# Patient Record
Sex: Male | Born: 2000 | Race: White | Hispanic: No | Marital: Single | Smoking: Current every day smoker
Health system: Southern US, Community
[De-identification: ages and names within clinical notes are randomized; demographics above are authoritative.]

## PROBLEM LIST (undated history)

## (undated) HISTORY — PX: SKIN GRAFT: SHX250

---
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
## 2018-12-06 ENCOUNTER — Emergency Department: Payer: Medicaid Other

## 2018-12-06 ENCOUNTER — Other Ambulatory Visit: Payer: Self-pay

## 2018-12-06 ENCOUNTER — Emergency Department
Admission: EM | Admit: 2018-12-06 | Discharge: 2018-12-06 | Disposition: A | Discharge status: Home / Self Care | Payer: Medicaid Other | Attending: Emergency Medicine | Admitting: Emergency Medicine

## 2018-12-06 DIAGNOSIS — R0602 Shortness of breath: Secondary | ICD-10-CM | POA: Insufficient documentation

## 2018-12-06 DIAGNOSIS — F17210 Nicotine dependence, cigarettes, uncomplicated: Secondary | ICD-10-CM | POA: Diagnosis not present

## 2018-12-06 DIAGNOSIS — F1120 Opioid dependence, uncomplicated: Secondary | ICD-10-CM | POA: Diagnosis not present

## 2018-12-06 DIAGNOSIS — R079 Chest pain, unspecified: Secondary | ICD-10-CM | POA: Diagnosis present

## 2018-12-06 DIAGNOSIS — Z720 Tobacco use: Secondary | ICD-10-CM

## 2018-12-06 LAB — CBC
HCT: 43.4 % (ref 39.0–52.0)
Hemoglobin: 14.7 g/dL (ref 13.0–17.0)
MCH: 29.3 pg (ref 26.0–34.0)
MCHC: 33.9 g/dL (ref 30.0–36.0)
MCV: 86.5 fL (ref 80.0–100.0)
Platelets: 280 10*3/uL (ref 150–400)
RBC: 5.02 MIL/uL (ref 4.22–5.81)
RDW: 11.9 % (ref 11.5–15.5)
WBC: 5.9 10*3/uL (ref 4.0–10.5)
nRBC: 0 % (ref 0.0–0.2)

## 2018-12-06 LAB — BASIC METABOLIC PANEL
Anion gap: 4 — ABNORMAL LOW (ref 5–15)
BUN: 11 mg/dL (ref 6–20)
CO2: 28 mmol/L (ref 22–32)
Calcium: 9.5 mg/dL (ref 8.9–10.3)
Chloride: 104 mmol/L (ref 98–111)
Creatinine, Ser: 0.81 mg/dL (ref 0.61–1.24)
GFR calc Af Amer: >60 mL/min (ref >60)
Glucose, Bld: 119 mg/dL — ABNORMAL HIGH (ref 70–99)
Potassium: 4.5 mmol/L (ref 3.5–5.1)
Sodium: 136 mmol/L (ref 135–145)

## 2018-12-06 LAB — TROPONIN I: Troponin I: <0.03 ng/mL (ref <0.03)

## 2018-12-06 MED ORDER — IOHEXOL 350 MG/ML SOLN
75.0000 mL | Freq: Once | INTRAVENOUS | Status: AC | PRN
  Administered 2018-12-06: 75 mL via INTRAVENOUS
  Filled 2018-12-06: qty 75

## 2018-12-06 NOTE — Discharge Instructions (Addendum)
Return to the emergency department if you develop severe pain, shortness of breath, lightheadedness or fainting, fever, thoughts of hurting yourself or anyone else, or any other symptoms concerning to you.

## 2018-12-06 NOTE — BH Assessment (Signed)
Per request of ER MD Sharma Covert), writer provided the pt. with information and instructions on how to access Outpatient [facility] Treatment ([facility]).  Patient denies SI/HI and AV/H.  _______________ [facility] [address],  [place], [place] [address] [phone]  [facility] [address]  [place], [place] [address] [phone]

## 2018-12-06 NOTE — ED Notes (Signed)
Patient transported to CT 

## 2018-12-06 NOTE — ED Triage Notes (Signed)
A&O, ambulatory. States chest pressure and hard time breathing. Appears anxiety. States symptoms x 1 week. Speaking in complete sentences. No distress noted. Was sent from [facility].

## 2018-12-06 NOTE — ED Notes (Signed)
[facility] remains in pt chart and will not let this RN verify pt hx. Pt states he smokes and uses oxycodone recreationally but denies ETOH. Denies any medical hx, only surgical hx is skin graft per pt.

## 2018-12-06 NOTE — ED Provider Notes (Addendum)
[facility] Emergency Department Provider Note  ____________________________________________  Time seen: Approximately 3:48 PM  I have reviewed the triage vital signs and the nursing notes.   HISTORY  Chief Complaint Chest Pain    HPI Jesse Holland is a 18 y.o. male with a history of 2 pack/day smoking as well as opioid abuse presenting for chest pain.  The patient reports that for the last several weeks, he develops a chest pain under the left chest and axilla, with shortness of breath.  This is associated with "having to remind myself to breathe" and concerns that he will stop breathing altogether.  He feels that this is something that could kill him.  He reports that he has been self decreasing his daily oxycodone use, from 80 mg daily now to 10 mg daily.  He continues to smoke 2 packs/day.  Last week, he attempted to take a Suboxone due to withdrawal symptoms, and after smoking cigarettes, his chest pain symptoms got worse.  I have asked him about anxiety and panic attacks, which he states he has had in the past, but does not feel the symptoms are similar.  He reports he was seen last weekend in the emergency department at [facility] " but they treated me like I was a joke."  He is concerned that no one is listening to him.  FH: No family history of blood clots.  SH: Opioid addiction.  No past medical history on file.  There are no active problems to display for this patient.    Allergies Patient has no known allergies.  No family history on file.  Social History Social History   Tobacco Use  . Smoking status: Not on file  Substance Use Topics  . Alcohol use: Not on file  . Drug use: Not on file    Review of Systems Constitutional: No fever/chills.  No lightheadedness or syncope. Eyes: No visual changes. ENT: No sore throat. No congestion or rhinorrhea. Cardiovascular: Positive chest pain. Denies palpitations. Respiratory: Positive  shortness of breath.  No cough. Gastrointestinal: No abdominal pain.  No nausea, no vomiting.  No diarrhea.  No constipation. Genitourinary: Negative for dysuria. Musculoskeletal: Negative for back pain. Skin: Negative for rash. Neurological: Negative for headaches. No focal numbness, tingling or weakness.  Psychiatric:Positive oxycodone abuse.  Positive smoking abuse.   ____________________________________________   PHYSICAL EXAM:  VITAL SIGNS: ED Triage Vitals  Enc Vitals Group     BP 12/06/18 1401 (!) 148/84     Pulse Rate 12/06/18 1401 (!) 104     Resp 12/06/18 1401 18     Temp 12/06/18 1401 98.4 F (36.9 C)     Temp Source 12/06/18 1401 Oral     SpO2 12/06/18 1401 100 %     Weight 12/06/18 1400 150 lb (68 kg)     Height 12/06/18 1400 5\' 8"  (1.727 m)     Head Circumference --      Peak Flow --      Pain Score 12/06/18 1359 0     Pain Loc --      Pain Edu? --      Excl. in GC? --     Constitutional: Alert and oriented. Answers questions appropriately. Eyes: Conjunctivae are normal.  EOMI. No scleral icterus. Head: Atraumatic. Nose: No congestion/rhinnorhea. Mouth/Throat: Mucous membranes are moist.  Neck: No stridor.  Supple.  No JVD.  No meningismus. Cardiovascular: Normal rate, regular rhythm. No murmurs, rubs or gallops.  Respiratory: Normal respiratory effort.  No accessory muscle use or retractions. Lungs CTAB.  No wheezes, rales or ronchi. Gastrointestinal: Soft, nontender and nondistended.  No guarding or rebound.  No peritoneal signs. Musculoskeletal: No LE edema. No ttp in the calves or palpable cords.  Negative Homan's sign. Neurologic:  A&Ox3.  Speech is clear.  Face and smile are symmetric.  EOMI.  Moves all extremities well. Skin:  Skin is warm, dry and intact. No rash noted. Psychiatric: Intermittently agitated during my exam, but I am able to calm him down with reassuring him verbally. ____________________________________________   LABS (all labs  ordered are listed, but only abnormal results are displayed)  Labs Reviewed  BASIC METABOLIC PANEL - Abnormal; Notable for the following components:      Result Value   Glucose, Bld 119 (*)    Anion gap 4 (*)    All other components within normal limits  CBC  TROPONIN I   ____________________________________________  EKG  ED ECG REPORT I, Anne-Caroline Sharma Covert, the attending physician, personally viewed and interpreted this ECG.   Date: 12/06/2018  EKG Time: 1356  Rate: 88  Rhythm: normal sinus rhythm  Axis: normal  Intervals:none  ST&T Change: No STEMI  ____________________________________________  RADIOLOGY  Dg Chest 2 View  Result Date: 12/06/2018 CLINICAL DATA:  Mid chest pressure and shortness of breath for 5 days, chest pain centrally radiating to LEFT side, smoker EXAM: CHEST - 2 VIEW COMPARISON:  None FINDINGS: Normal heart size, mediastinal contours, and pulmonary vascularity. Mild peribronchial thickening. No pulmonary infiltrate, pleural effusion, or pneumothorax. Bones unremarkable. IMPRESSION: Mild bronchitic changes without infiltrate. Electronically Signed   By: Ulyses Southward M.D.   On: 12/06/2018 14:31    ____________________________________________   PROCEDURES  Procedure(s) performed: None  Procedures  Critical Care performed: No ____________________________________________   INITIAL IMPRESSION / ASSESSMENT AND PLAN / ED COURSE  Pertinent labs & imaging results that were available during my care of the patient were reviewed by me and considered in my medical decision making (see chart for details).  18 y.o. male with a history of opioid addiction, 2 pack/day smoking, presenting with chest pain and shortness of breath.  Overall, the patient is hemodynamically stable.  His EKG does not show any ischemia, arrhythmia, or evidence of Brugada syndrome, hypertrophy or prolonged QTC.  The patient has a chest x-ray which shows no acute intracranial process.   We will get a CT of the chest to rule out PE, but my suspicion for this is low.  The patient's not anemic, his electrolytes are reassuring and his troponin is negative.  I do not suspect ACS or MI.  I have low suspicious that there may be psychiatric causes, including panic attacks, for his chest pain and shortness of breath.  However, the patient denies this.  I have asked him if he would like help with continuing to decrease his oxycodone use, and he has agreed so I have called [facility] he will give him outpatient center information.  I anticipate discharge home.  ----------------------------------------- 4:47 PM on 12/06/2018 -----------------------------------------  The patient CT chest does not show any acute cardiopulmonary process, including PE.  At this time, the patient will be discharged home.  He has been seen by Jerilynn Som from [facility] and given outpatient follow-up instructions.  ____________________________________________  FINAL CLINICAL IMPRESSION(S) / ED DIAGNOSES  Final diagnoses:  Tobacco abuse  Narcotic addiction (HCC)  Chest pain, unspecified type  Shortness of breath         NEW MEDICATIONS STARTED DURING  THIS VISIT:  New Prescriptions   No medications on file      Rockne Menghini, MD 12/06/18 1558    Rockne Menghini, MD 12/06/18 [phone]

## 2018-12-07 ENCOUNTER — Telehealth: Payer: Self-pay | Admitting: Physician Assistant

## 2018-12-07 NOTE — Telephone Encounter (Signed)
Patient called. Busy with patients. Front Information systems manager took message of name of patient and phone number. Called patient back on cell phone. No answer. Left voicemail. Believe patient is looking for contact information for PCP and behavioral health. SFS PA-C

## 2019-06-13 ENCOUNTER — Emergency Department
Admission: EM | Admit: 2019-06-13 | Discharge: 2019-06-14 | Disposition: A | Discharge status: Home / Self Care | Payer: Medicaid Other | Attending: Emergency Medicine | Admitting: Emergency Medicine

## 2019-06-13 ENCOUNTER — Encounter: Payer: Self-pay | Admitting: Emergency Medicine

## 2019-06-13 ENCOUNTER — Other Ambulatory Visit: Payer: Self-pay

## 2019-06-13 DIAGNOSIS — Z20828 Contact with and (suspected) exposure to other viral communicable diseases: Secondary | ICD-10-CM | POA: Insufficient documentation

## 2019-06-13 DIAGNOSIS — F17210 Nicotine dependence, cigarettes, uncomplicated: Secondary | ICD-10-CM | POA: Insufficient documentation

## 2019-06-13 DIAGNOSIS — F329 Major depressive disorder, single episode, unspecified: Secondary | ICD-10-CM | POA: Diagnosis not present

## 2019-06-13 DIAGNOSIS — F322 Major depressive disorder, single episode, severe without psychotic features: Secondary | ICD-10-CM

## 2019-06-13 DIAGNOSIS — F32A Depression, unspecified: Secondary | ICD-10-CM

## 2019-06-13 LAB — ETHANOL: Alcohol, Ethyl (B): <10 mg/dL (ref <10)

## 2019-06-13 LAB — COMPREHENSIVE METABOLIC PANEL
ALT: 21 U/L (ref 0–44)
AST: 24 U/L (ref 15–41)
Albumin: 4.5 g/dL (ref 3.5–5.0)
Alkaline Phosphatase: 80 U/L (ref 38–126)
Anion gap: 8 (ref 5–15)
BUN: 13 mg/dL (ref 6–20)
CO2: 26 mmol/L (ref 22–32)
Calcium: 9.4 mg/dL (ref 8.9–10.3)
Chloride: 103 mmol/L (ref 98–111)
Creatinine, Ser: 0.92 mg/dL (ref 0.61–1.24)
GFR calc Af Amer: >60 mL/min (ref >60)
GFR calc non Af Amer: >60 mL/min (ref >60)
Glucose, Bld: 126 mg/dL — ABNORMAL HIGH (ref 70–99)
Potassium: 3.7 mmol/L (ref 3.5–5.1)
Sodium: 137 mmol/L (ref 135–145)
Total Bilirubin: 0.4 mg/dL (ref 0.3–1.2)
Total Protein: 7.1 g/dL (ref 6.5–8.1)

## 2019-06-13 LAB — CBC
HCT: 39.2 % (ref 39.0–52.0)
Hemoglobin: 13.4 g/dL (ref 13.0–17.0)
MCH: 29.5 pg (ref 26.0–34.0)
MCHC: 34.2 g/dL (ref 30.0–36.0)
MCV: 86.3 fL (ref 80.0–100.0)
Platelets: 299 10*3/uL (ref 150–400)
RBC: 4.54 MIL/uL (ref 4.22–5.81)
RDW: 12.1 % (ref 11.5–15.5)
WBC: 9.9 10*3/uL (ref 4.0–10.5)
nRBC: 0 % (ref 0.0–0.2)

## 2019-06-13 NOTE — ED Triage Notes (Signed)
Patient ambulatory to triage with steady gait, without difficulty or distress noted, brought in by [facility] for IVC; pt denies SI or HI but st "just somethings between me and my mom"

## 2019-06-14 DIAGNOSIS — F322 Major depressive disorder, single episode, severe without psychotic features: Secondary | ICD-10-CM

## 2019-06-14 DIAGNOSIS — F329 Major depressive disorder, single episode, unspecified: Secondary | ICD-10-CM

## 2019-06-14 LAB — URINE DRUG SCREEN, QUALITATIVE (ARMC ONLY)
Amphetamines, Ur Screen: NOT DETECTED
Barbiturates, Ur Screen: NOT DETECTED
Benzodiazepine, Ur Scrn: POSITIVE — AB
Cannabinoid 50 Ng, Ur ~~LOC~~: POSITIVE — AB
Cocaine Metabolite,Ur ~~LOC~~: NOT DETECTED
MDMA (Ecstasy)Ur Screen: NOT DETECTED
Methadone Scn, Ur: NOT DETECTED
Opiate, Ur Screen: POSITIVE — AB
Phencyclidine (PCP) Ur S: NOT DETECTED
Tricyclic, Ur Screen: NOT DETECTED

## 2019-06-14 LAB — ACETAMINOPHEN LEVEL: Acetaminophen (Tylenol), Serum: <10 ug/mL — ABNORMAL LOW (ref 10–30)

## 2019-06-14 LAB — SARS CORONAVIRUS 2 BY RT PCR (HOSPITAL ORDER, PERFORMED IN ~~LOC~~ HOSPITAL LAB): SARS Coronavirus 2: NEGATIVE

## 2019-06-14 LAB — SALICYLATE LEVEL: Salicylate Lvl: <7 mg/dL (ref 2.8–30.0)

## 2019-06-14 NOTE — ED Notes (Signed)
Talking on phone with mother.

## 2019-06-14 NOTE — Consult Note (Signed)
[facility] Face-to[facility]Face [facility] Consult   Reason for Consult: Suicidal ideation Referring Physician:  Dr. Don PerkingVeronese Patient Identification: Jesse Holland MRN:  [id number] Principal Diagnosis: <principal problem not specified> Diagnosis:  Active Problems:   MDD (major depressive disorder), severe (HCC)   Total Time spent with patient: 30 minutes  Subjective: "1 day I called my parents and he told me they had moved to [place]." Jesse Holland is a 18 y.o. male patient presented to [facility] ED via [place] [facility] under involuntary commitment status (IVC).  The patient denies seeing a psychiatrist currently.  He denies being prescribed any type of psychiatric medication.  He admits to seeing a psychiatrist/therapist as a young child.  He admits to smoking marijuana 2-3 times a week. The patient presented that he is not suicidal, homicidal or have any thoughts of self-injurious behavior.  He states, that his parents up and moved to [place] without notifying him or his sister that they were moving and not coming back to [place] for a few years.  The patient stated he called his parents one day and asked where are they?  His mom response to him,  that they were in [place] and not coming back to [place].  He discussed, his parents sold their house and he had to find an apartment to live.  He did admit that his mom does help him with his rent.  He discussed that he has a younger sister who is 20 years old and she lives in [place] with her boyfriend.  He endorses, that tonight (Wednesday) he had gotten off work and is still upset about his parents leaving [place] the way they did.  He discussed he was texting his mother and became upset during the conversation which he voiced that he was going to kill himself.  The patient denies, wanting to hurt himself or hurt anybody else.  He states, "I am having a difficult time dealing with how my parents went about moving out of [place]."    The  patient was seen face-to-face by this provider; chart reviewed and consulted with Dr. Don PerkingVeronese on 06/14/2019 due to the care of the patient. It was discussed with the EDP that the patient does not meet criteria to be admitted to the inpatient unit.  On evaluation the patient is alert and oriented x4, calm and cooperative, and mood-congruent with affect. The patient does not appear to be responding to internal or external stimuli. Neither is the patient presenting with any delusional thinking. The patient denies auditory or visual hallucinations. The patient denies any suicidal, homicidal, or self-harm ideations. The patient is not presenting with any psychotic or paranoid behaviors. During an encounter with the patient, he/she was able to answer questions appropriately. Collateral was not obtained 3 attempts were made in contacting the patient's dad Mr. Evette GeorgesJohn Hogland [phone].  This provider left and HIPAA approved message.   Plan: The patient is not a safety risk to self or others and does not require psychiatric inpatient admission for stabilization and treatment.  Collateral needs to be obtained before patient can be discharged.  And patient needs to produce urine for UDS before discharge.  HPI: Per Dr. Don PerkingVeronese; Jesse Holland is a 18 y.o. male with a history of oppositional defiant disorder and mood disorder who presents IVC by police for suicidal ideation.  According to IVC papers, patient send his mother a text saying that he was going to kill himself.  He seemed upset  due to the fact that his parents moved to [place] and left him in [place].  He had gotten into an argument with his mom over this topic when he sent her the text.  Patient refuses to provide any history at this time.  He keeps saying "I am very tired, and I am just trying to go to sleep". He denies any medical problems, drugs, or alcohol.  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 No pertinent past medical history.  Past Surgical History:  Procedure Laterality Date  . SKIN GRAFT     Family History: No family history on file. Family Psychiatric  History: Social History:  Social History   Substance and Sexual Activity  Alcohol Use Not Currently     Social History   Substance and Sexual Activity  Drug Use Not on file    Social History   Socioeconomic History  . Marital status: Single    Spouse name: Not on file  . Number of children: Not on file  . Years of education: Not on file  . Highest education level: Not on file  Occupational History  . Not on file  Social Needs  . Financial resource strain: Not on file  . Food insecurity    Worry: Not on file    Inability: Not on file  . Transportation needs    Medical: Not on file    Non-medical: Not on file  Tobacco Use  . Smoking status: Current Every Day Smoker  . Smokeless tobacco: Never Used  Substance and Sexual Activity  . Alcohol use: Not Currently  . Drug use: Not on file  . Sexual activity: Not on file  Lifestyle  . Physical activity    Days per week: Not on file    Minutes per session: Not on file  . Stress: Not on file  Relationships  . Social Herbalist on phone: Not on file    Gets together: Not on file    Attends religious service: Not on file    Active member of club or organization: Not on file    Attends meetings of clubs or organizations: Not on file    Relationship status: Not on file  Other Topics Concern  . Not on file  Social History Narrative  . Not on file   Additional Social History:    Allergies:  No Known Allergies  Labs:  Results for orders placed or performed during the hospital encounter of 06/13/19 (from the past 48 hour(s))  Comprehensive  metabolic panel     Status: Abnormal   Collection Time: 06/13/19 11:20 PM  Result Value Ref Range   Sodium 137 135 - 145 mmol/L   Potassium 3.7 3.5 - 5.1 mmol/L   Chloride 103 98 - 111 mmol/L   CO2 26 22 - 32 mmol/L   Glucose, Bld 126 (H) 70 - 99 mg/dL   BUN 13 6 - 20 mg/dL   Creatinine, Ser 0.92 0.61 - 1.24 mg/dL   Calcium 9.4 8.9 - 10.3 mg/dL   Total Protein 7.1 6.5 - 8.1 g/dL   Albumin 4.5 3.5 - 5.0 g/dL   AST 24 15 - 41 U/L   ALT 21 0 - 44 U/L   Alkaline Phosphatase 80 38 -  126 U/L   Total Bilirubin 0.4 0.3 - 1.2 mg/dL   GFR calc non Af Amer >60 >60 mL/min   GFR calc Af Amer >60 >60 mL/min   Anion gap 8 5 - 15    Comment: Performed at [facility], [address] [place], [place] [address]  Ethanol     Status: None   Collection Time: 06/13/19 11:20 PM  Result Value Ref Range   Alcohol, Ethyl (B) <10 <10 mg/dL    Comment: (NOTE) Lowest detectable limit for serum alcohol is 10 mg/dL. For medical purposes only. Performed at [facility], [address] [place], [place] [address]   Salicylate level     Status: None   Collection Time: 06/13/19 11:20 PM  Result Value Ref Range   Salicylate Lvl <7.0 2.8 - 30.0 mg/dL    Comment: Performed at [facility], [address] [place], [place] [address]  Acetaminophen level     Status: Abnormal   Collection Time: 06/13/19 11:20 PM  Result Value Ref Range   Acetaminophen (Tylenol), Serum <10 (L) 10 - 30 ug/mL    Comment: (NOTE) Therapeutic concentrations vary significantly. A range of 10-30 ug/mL  may be an effective concentration for many patients. However, some  are best treated at concentrations outside of this range. Acetaminophen concentrations >150 ug/mL at 4 hours after ingestion  and >50 ug/mL at 12 hours after ingestion are often associated with  toxic reactions. Performed at [facility], [address] [place], [place] [address]   cbc     Status: None   Collection Time:  06/13/19 11:20 PM  Result Value Ref Range   WBC 9.9 4.0 - 10.5 K/uL   RBC 4.54 4.22 - 5.81 MIL/uL   Hemoglobin 13.4 13.0 - 17.0 g/dL   HCT 72.539.2 36.639.0 - 44.052.0 %   MCV 86.3 80.0 - 100.0 fL   MCH 29.5 26.0 - 34.0 pg   MCHC 34.2 30.0 - 36.0 g/dL   RDW 34.712.1 42.511.5 - 95.615.5 %   Platelets 299 150 - 400 K/uL   nRBC 0.0 0.0 - 0.2 %    Comment: Performed at [facility], [address] [place], [place] [address]    No current facility-administered medications for this encounter.    No current outpatient medications on file.    Musculoskeletal: Strength & Muscle Tone: within normal limits Gait & Station: normal Patient leans: N/A  Psychiatric Specialty Exam: Physical Exam  Nursing note and vitals reviewed. Constitutional: He is oriented to person, place, and time. He appears well-developed and well-nourished.  HENT:  Head: Normocephalic.  Eyes: Pupils are equal, round, and reactive to light.  Neck: Normal range of motion. Neck supple.  Cardiovascular: Normal rate.  Respiratory: Effort normal.  Musculoskeletal: Normal range of motion.  Neurological: He is alert and oriented to person, place, and time.  Skin: Skin is warm and dry.  Psychiatric: Judgment and thought content normal.    Review of Systems  Psychiatric/Behavioral: Positive for depression. The patient is nervous/anxious.   All other systems reviewed and are negative.   Blood pressure 128/73, pulse 82, temperature 98 F (36.7 C), temperature source Oral, resp. rate 18, SpO2 99 %.There is no height or weight on file to calculate BMI.  General Appearance: Casual  Eye Contact:  Good  Speech:  Clear and Coherent  Volume:  Normal  Mood:  Anxious and Depressed  Affect:  Congruent and Depressed  Thought Process:  Coherent  Orientation:  Full (Time, Place, and Person)  Thought Content:  WDL and Logical  Suicidal Thoughts:  No  Homicidal Thoughts:  No  Memory:  Immediate;   Good Recent;   Good Remote;   Good   Judgement:  Good  Insight:  Good  Psychomotor Activity:  Normal  Concentration:  Concentration: Good and Attention Span: Good  Recall:  Good  Fund of Knowledge:  Good  Language:  Good  Akathisia:  Negative  Handed:  Right  AIMS (if indicated):     Assets:  Desire for Improvement Resilience Social Support  ADL's:  Intact  Cognition:  WNL  Sleep:   Good     Treatment Plan Summary: Plan The patient does not meet criteria for psychiatric inpatient admission.  Disposition: No evidence of imminent risk to self or others at present.   Patient does not meet criteria for psychiatric inpatient admission. Supportive therapy provided about ongoing stressors. Discussed crisis plan, support from social network, calling 911, coming to the Emergency Department, and calling Suicide Hotline.  Gillermo MurdochJacqueline Shelton Soler, NP 06/14/2019 6:19 AM

## 2019-06-14 NOTE — ED Notes (Signed)
NP at bedside, pt gives permission to open phone to get phone number of friend Jesse Holland and Jesse Holland

## 2019-06-14 NOTE — ED Notes (Signed)
Hourly rounding reveals patient in room. No complaints, stable, in no acute distress. Q15 minute rounds and monitoring via Rover and Officer to continue.   

## 2019-06-14 NOTE — ED Provider Notes (Signed)
[facility] Emergency Department Provider Note  ____________________________________________  Time seen: Approximately 2:27 AM  I have reviewed the triage vital signs and the nursing notes.   HISTORY  Chief Complaint Mental Health Problem  Level 5 caveat:  Portions of the history and physical were unable to be obtained due to refusal to provide history   HPI Jesse Holland is a 18 y.o. male with a history of oppositional defiant disorder and mood disorder who presents IVC by police for suicidal ideation.  According to IVC papers, patient send his mother a text saying that he was going to kill himself.  He seemed upset due to the fact that his [place] moved to [place] and [place].  He had gotten into an argument with his mom over this topic when he sent her the text.  Patient refuses to provide any history at this time.  He keeps saying "I am very tired, and I am just trying to go to sleep".  He denies any medical problems, drugs, or alcohol.  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 SKIN GRAFT     Allergies Patient has no known allergies.  No family history on file.  Social History Social History   Tobacco Use  . Smoking status: Current Every Day Smoker  . Smokeless tobacco: Never Used  Substance Use Topics  . Alcohol use: Not Currently  . Drug use: Not on file    Review of Systems  Constitutional: Negative for fever. Eyes: Negative for visual changes. ENT: Negative for sore throat. Neck: No neck pain  Cardiovascular: Negative for chest pain. Respiratory: Negative for shortness of breath. Gastrointestinal: Negative for abdominal pain, vomiting or diarrhea. Musculoskeletal: Negative for back pain. Skin: Negative for rash. Neurological: Negative for  headaches, weakness or numbness. Psych: No SI or HI  ____________________________________________   PHYSICAL EXAM:  VITAL SIGNS: ED Triage Vitals [06/13/19 2316]  Enc Vitals Group     BP 128/73     Pulse Rate 82     Resp 18     Temp 98 F (36.7 C)     Temp Source Oral     SpO2 99 %     Weight      Height      Head Circumference      Peak Flow      Pain Score 0     Pain Loc      Pain Edu?      Excl. in GC?     Constitutional: Alert and oriented. Well appearing and in no apparent distress. HEENT:      Head: Normocephalic and atraumatic.         Eyes: Conjunctivae are normal. Sclera is non-icteric.       Mouth/Throat: Mucous membranes are moist.       Neck: Supple with no signs of meningismus. Cardiovascular: Regular rate and rhythm.  Respiratory: Normal respiratory effort.  Gastrointestinal: Soft, non tender, and non distended  Musculoskeletal: No edema, cyanosis, or erythema of extremities. Neurologic: Normal speech and language. Face is symmetric. Moving all extremities. No gross focal neurologic deficits are appreciated. Skin: Skin is warm, dry and intact. No rash noted. Psychiatric: Mood and affect are blunt. Speech and behavior are normal.  ____________________________________________   LABS (all labs ordered are listed, but only abnormal results are  displayed)  Labs Reviewed  COMPREHENSIVE METABOLIC PANEL - Abnormal; Notable for the following components:      Result Value   Glucose, Bld 126 (*)    All other components within normal limits  ACETAMINOPHEN LEVEL - Abnormal; Notable for the following components:   Acetaminophen (Tylenol), Serum <10 (*)    All other components within normal limits  ETHANOL  SALICYLATE LEVEL  CBC  URINE DRUG SCREEN, QUALITATIVE ([facility] ONLY)   ____________________________________________  EKG  none  ____________________________________________  RADIOLOGY  none  ____________________________________________    PROCEDURES  Procedure(s) performed: None Procedures Critical Care performed:  None ____________________________________________   INITIAL IMPRESSION / ASSESSMENT AND PLAN / ED COURSE  18 y.o. male with a history of oppositional defiant disorder and mood disorder who presents IVC by police for suicidal ideation.  Will maintain IVC papers until evaluated by psychiatry.  Labs for medical clearance with no acute findings.      As part of my medical decision making, I reviewed the following data within the [facility] notes reviewed and incorporated, Labs reviewed , Old chart reviewed, A consult was requested and obtained from this/these consultant(s) Psychiatry, Notes from prior ED visits and [place] Controlled Substance Database   Patient was evaluated in Emergency Department today for the symptoms described in the history of present illness. Patient was evaluated in the context of the global COVID-19 pandemic, which necessitated consideration that the patient might be at risk for infection with the SARS-CoV-2 virus that causes COVID-19. Institutional protocols and algorithms that pertain to the evaluation of patients at risk for COVID-19 are in a state of rapid change based on information released by regulatory bodies including the CDC and federal and state organizations. These policies and algorithms were followed during the patient's care in the ED.   ____________________________________________   FINAL CLINICAL IMPRESSION(S) / ED DIAGNOSES   Final diagnoses:  Depression, unspecified depression type      NEW MEDICATIONS STARTED DURING THIS VISIT:  ED Discharge Orders    None       Note:  This document was prepared using Dragon voice recognition software and may include unintentional dictation errors.    Alfred Levins, [place], MD 06/14/19 [phone]

## 2019-06-14 NOTE — Consult Note (Signed)
Patient was seen by me face-to-face. Patient continues to not meet inpatient criteria and IVC has been rescinded. Collateral gained from his foreman, Grace Isaac, who reports that the patient is not a threat to himself or anyone else and he will be able to pick him up and take him to work today. He also reports that he has known the patient for a long time and helped him get his job. The patient finally admitted o oxycodone use and we will provide him with resources for outpatient substance use treatment.

## 2019-06-14 NOTE — ED Notes (Signed)
Continue to await breakfast tray to be delivered. 

## 2019-06-14 NOTE — ED Notes (Signed)
Pt. Transferred from Triage to room [facility] after dressing out and screening for contraband. Report to include Situation, Background, Assessment and Recommendations from RN. Pt. Oriented to Quad including Q15 minute rounds as well as Engineer, drilling for their protection. Patient is alert and oriented, warm and dry in no acute distress. Patient denies SI, HI, and AVH. Pt. Encouraged to let me know if needs arise.

## 2019-06-14 NOTE — ED Notes (Signed)
IVC  PAPERS  RESCINDED  PER  TRAVIS  MONEY  NP   INFORMED  RN  ALLY

## 2019-06-14 NOTE — ED Notes (Signed)
Pt has ride home, [place]. Given all of belongings and breakfast prior to discharge. Pt alert and oriented X4, active, cooperative, pt in NAD. RR even and unlabored, color WNL.  Pt informed to return if any life threatening symptoms occur.  Discharge and followup instructions reviewed. Ambulates safely.

## 2019-06-14 NOTE — BH Assessment (Signed)
Per the request of Psych Nurse Practitioner Darnelle Maffucci Money), writer provided the patient with out patient referral information to address his [place] [employer] [place] issues.  Patient denies SI/HI and AV/H.

## 2019-06-14 NOTE — ED Provider Notes (Signed)
The patient has been evaluated at bedside by psychiatry.  Patient is clinically stable.  Not felt to be a danger to self or others.  No SI or Hi.  No indication for inpatient psychiatric admission at this time.  Appropriate for continued outpatient therapy.    Merlyn Lot, MD 06/14/19 [phone]

## 2019-06-15 DIAGNOSIS — F329 Major depressive disorder, single episode, unspecified: Secondary | ICD-10-CM | POA: Insufficient documentation

## 2019-06-15 DIAGNOSIS — F32A Depression, unspecified: Secondary | ICD-10-CM | POA: Insufficient documentation

## 2020-05-04 IMAGING — CT CT ANGIO CHEST
2 of 6 series · 19 of 46 positions shown · IV contrast (APPLIED)
Comparison: Chest radiographs earlier today.

CLINICAL DATA: 17-year-old male with chest pain and shortness of
breath for the last several weeks.

EXAM:
CT ANGIOGRAPHY CHEST WITH CONTRAST
TECHNIQUE: Multidetector CT imaging of the chest was performed using the
standard protocol during bolus administration of intravenous
contrast. Multiplanar CT image reconstructions and MIPs were
obtained to evaluate the vascular anatomy.
CONTRAST:  75mL OMNIPAQUE IOHEXOL 350 MG/ML SOLN

[Series 5: thins · axial · 0.61mm/px · z∈[-469,-222]mm · 16 of 271 slices shown]
[im 12/271  lung]
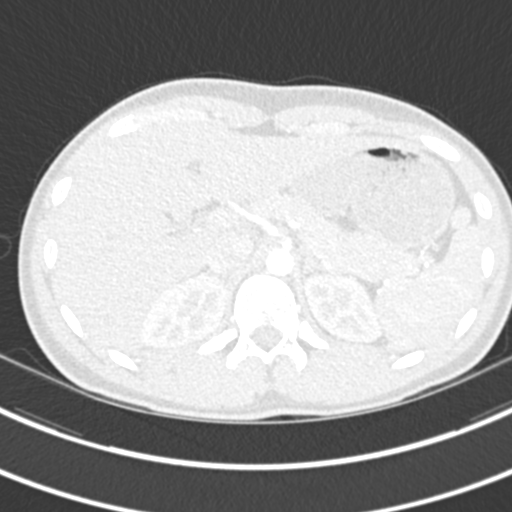
[im 36/271  soft-tissue]
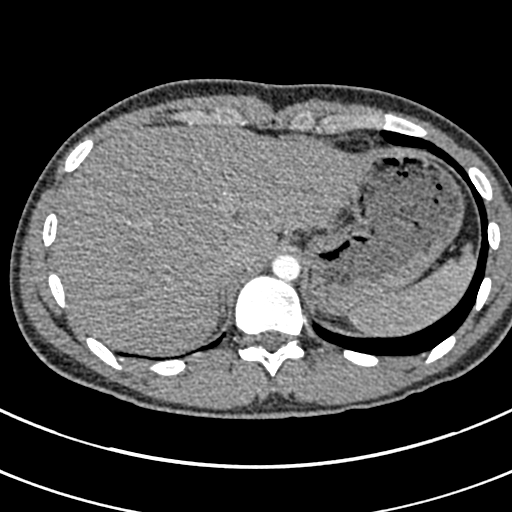
[im 47/271  lung]
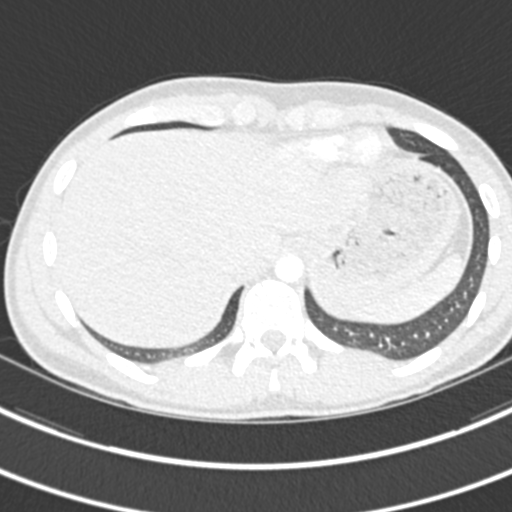
[im 59/271  soft-tissue]
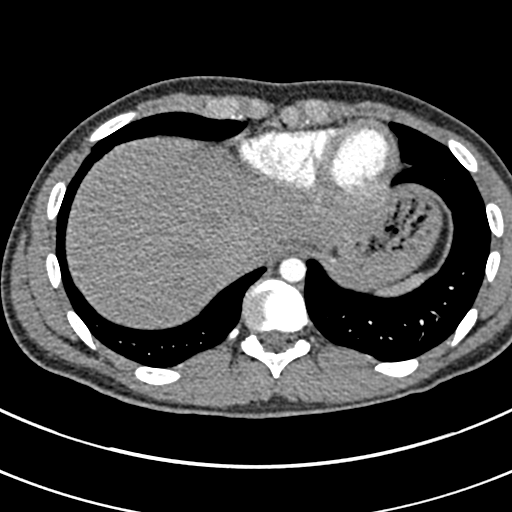
[im 83/271  lung]
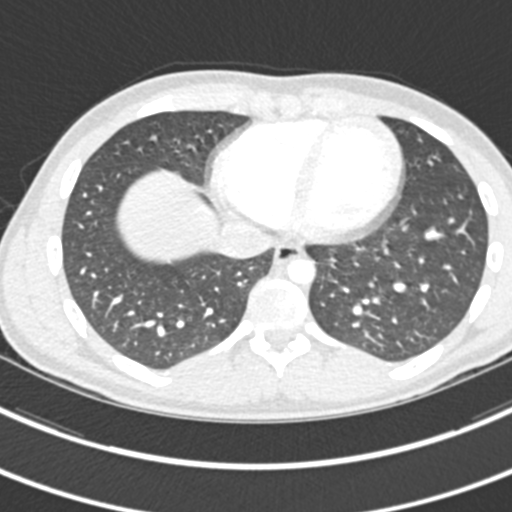
[im 94/271  soft-tissue]
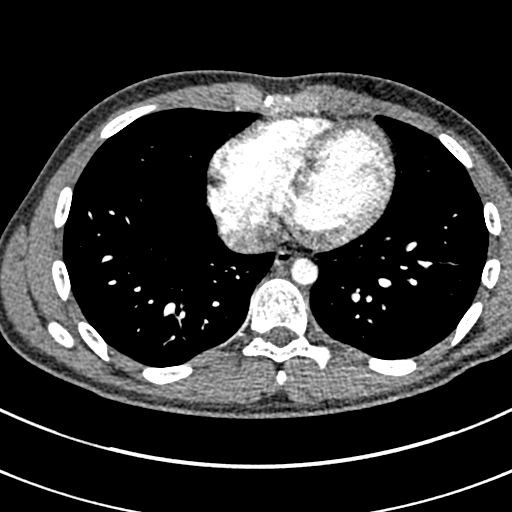
[im 106/271  lung]
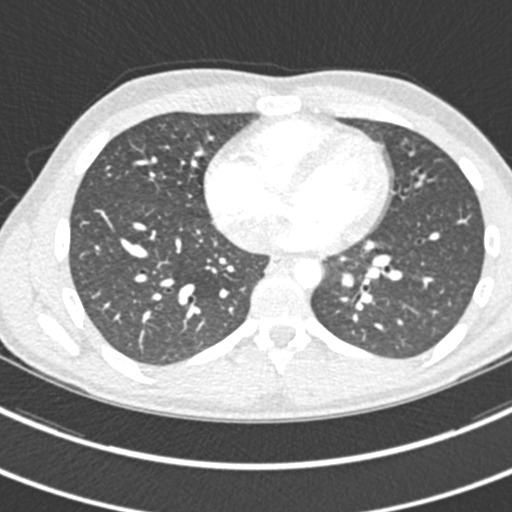
[im 130/271  soft-tissue]
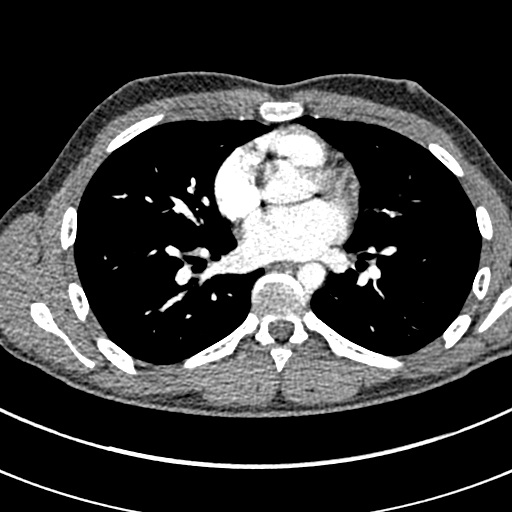
[im 141/271  lung]
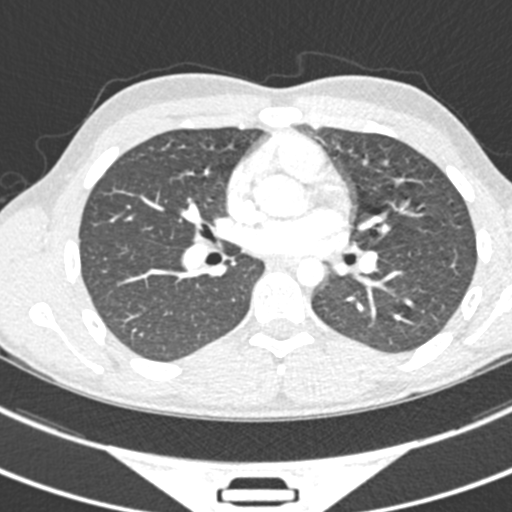
[im 165/271  soft-tissue]
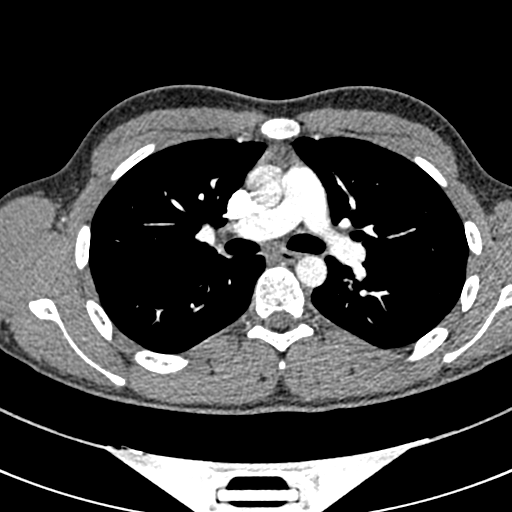
[im 177/271  lung]
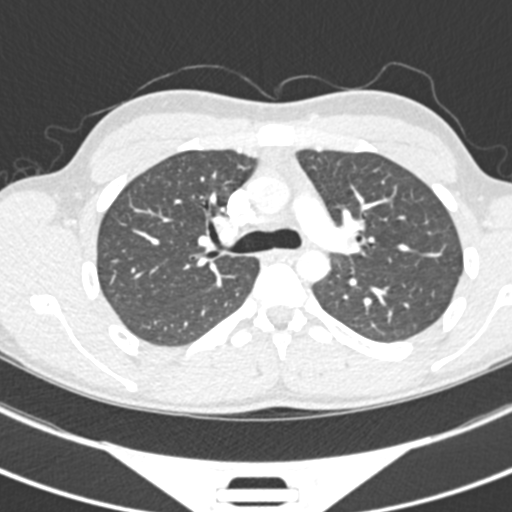
[im 188/271  soft-tissue]
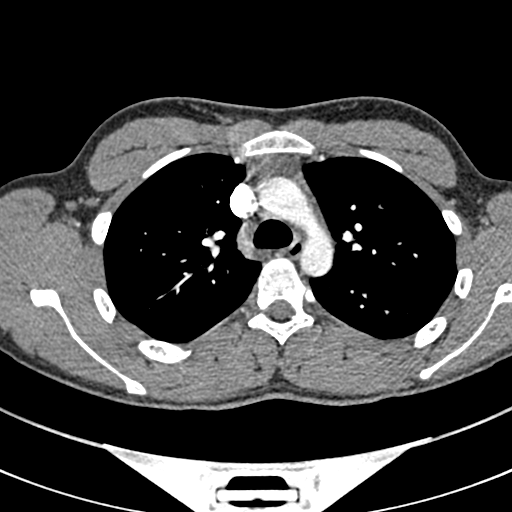
[im 212/271  lung]
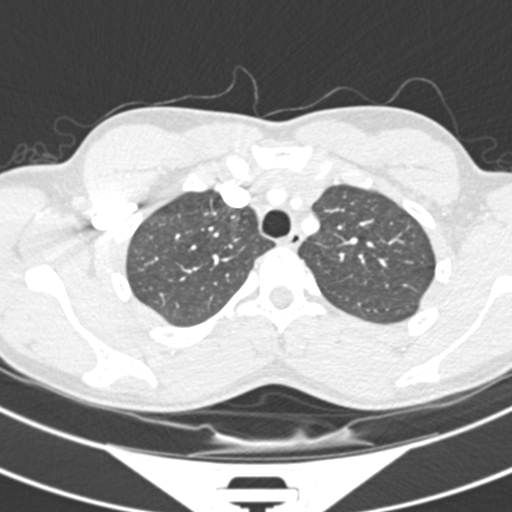
[im 224/271  soft-tissue]
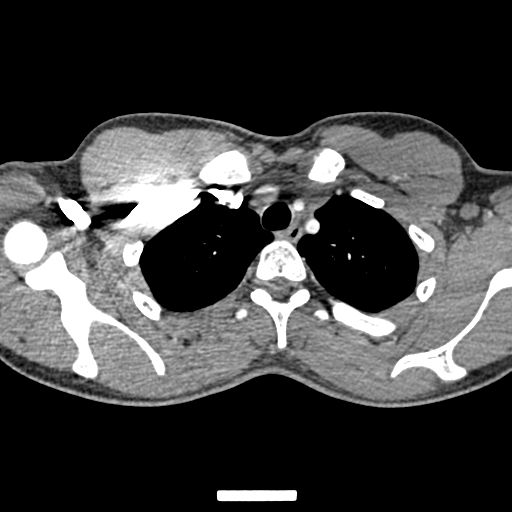
[im 235/271  lung]
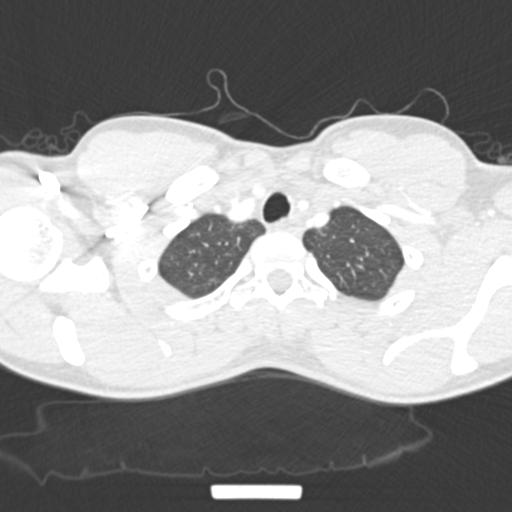
[im 259/271  soft-tissue]
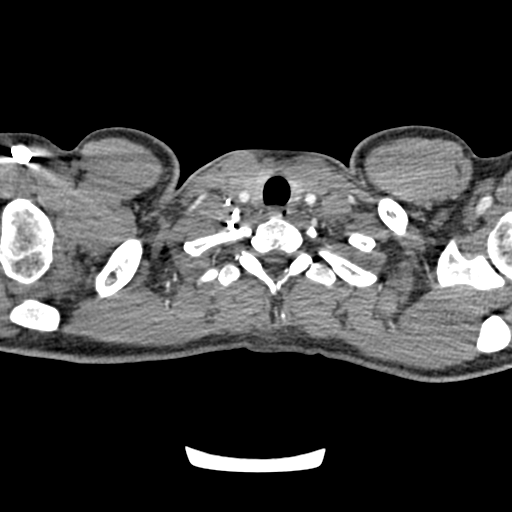

[Series 7: coronal mpr · coronal · 0.55mm/px · 3 of 73 slices shown]
[im 19/73  soft-tissue]
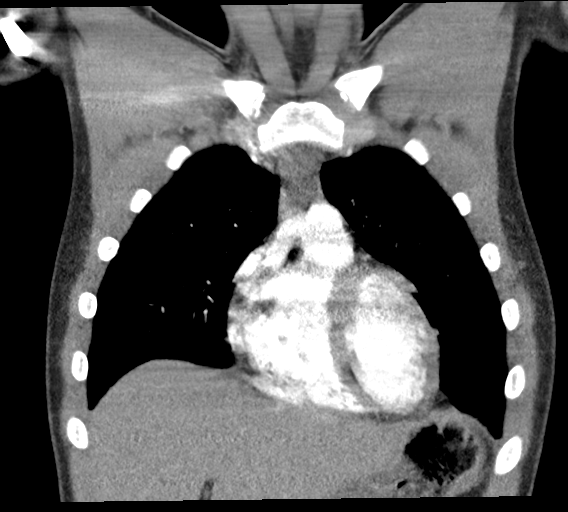
[im 37/73  soft-tissue]
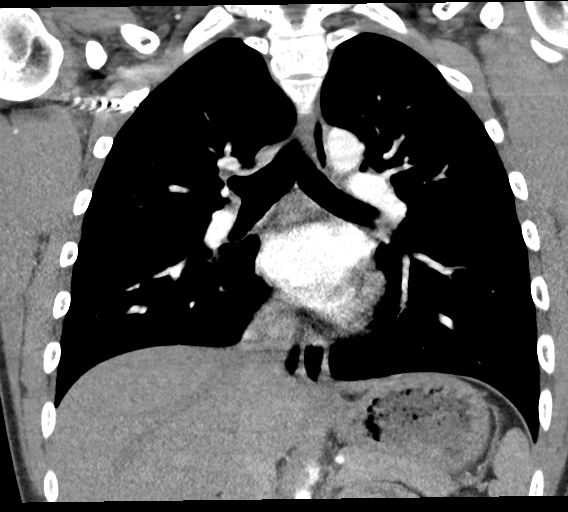
[im 55/73  soft-tissue]
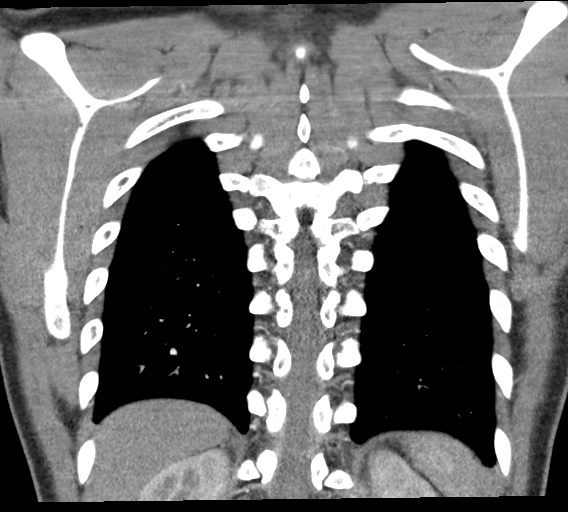

[19 of 46 positions shown; findings below may reference images not displayed]

FINDINGS: Cardiovascular: Adequate contrast bolus timing in the pulmonary
arterial tree. A

No focal filling defect identified in the pulmonary arteries to
suggest acute pulmonary embolism.

No calcified coronary artery atherosclerosis is evident. Normal
visible aorta and proximal great vessels. No cardiomegaly or
pericardial effusion.

Mediastinum/Nodes: Negative. Small volume residual thymus (normal
variant).

Lungs/Pleura: Major airways are patent. Low normal lung volumes.
Both lungs appear clear. No pleural effusion.

Upper Abdomen: Negative visible liver, gallbladder, spleen,
pancreas, adrenal glands, kidneys, and proximal bowel in the upper
abdomen.

Musculoskeletal: No osseous abnormality identified.

Review of the MIP images confirms the above findings.
IMPRESSION: Negative CTA chest.  No evidence of acute pulmonary embolus.
